# Patient Record
Sex: Female | Born: 1975 | Race: White | Hispanic: No | Marital: Single | Smoking: Current some day smoker
Health system: Southern US, Community
[De-identification: ages and names within clinical notes are randomized; demographics above are authoritative.]

## PROBLEM LIST (undated history)

## (undated) DIAGNOSIS — F1010 Alcohol abuse, uncomplicated: Secondary | ICD-10-CM

---

## 1998-10-04 ENCOUNTER — Inpatient Hospital Stay (HOSPITAL_COMMUNITY): Admission: AD | Admit: 1998-10-04 | Discharge: 1998-10-06 | Payer: Self-pay | Admitting: Obstetrics

## 1998-10-13 ENCOUNTER — Inpatient Hospital Stay (HOSPITAL_COMMUNITY): Admission: AD | Admit: 1998-10-13 | Discharge: 1998-10-15 | Payer: Self-pay | Admitting: Obstetrics

## 1998-10-14 ENCOUNTER — Encounter: Payer: Self-pay | Admitting: Obstetrics

## 1998-10-26 ENCOUNTER — Encounter: Admission: RE | Admit: 1998-10-26 | Discharge: 1998-10-26 | Payer: Self-pay | Admitting: Obstetrics & Gynecology

## 2003-11-20 ENCOUNTER — Emergency Department (HOSPITAL_COMMUNITY): Admission: AC | Admit: 2003-11-20 | Discharge: 2003-11-20 | Payer: Self-pay
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
## 2013-10-12 ENCOUNTER — Emergency Department (HOSPITAL_COMMUNITY)
Admission: EM | Admit: 2013-10-12 | Discharge: 2013-10-12 | Disposition: A | Discharge status: Home / Self Care | Payer: Self-pay | Attending: Emergency Medicine | Admitting: Emergency Medicine

## 2013-10-12 ENCOUNTER — Encounter (HOSPITAL_COMMUNITY): Payer: Self-pay | Admitting: Emergency Medicine

## 2013-10-12 DIAGNOSIS — F911 Conduct disorder, childhood-onset type: Secondary | ICD-10-CM | POA: Insufficient documentation

## 2013-10-12 DIAGNOSIS — F1010 Alcohol abuse, uncomplicated: Secondary | ICD-10-CM | POA: Insufficient documentation

## 2013-10-12 DIAGNOSIS — F17200 Nicotine dependence, unspecified, uncomplicated: Secondary | ICD-10-CM | POA: Insufficient documentation

## 2013-10-12 HISTORY — DX: Alcohol abuse, uncomplicated: F10.10

## 2013-10-12 NOTE — ED Provider Notes (Signed)
CSN: [id number]     Arrival date & time 10/12/13  1814 History   First MD Initiated Contact with Patient 10/12/13 2143     Chief Complaint  Patient presents with  . detox     alcohol     (Consider location/radiation/quality/duration/timing/severity/associated sxs/prior Treatment) HPI Comments: Patient is a 38 year old female who presents to the ED tonight for detox from alcohol. She drinks alcohol sporadically. She states "I can make dinner and have a glass of wine, or I can make dinner and not have a glass of wine". She does not drink every day. Her last drink was last night. She states she drank a few beers and a few shots. She would like detox because it was recommended by her lawyer to get inpatient treatment as she has 3 DUIs. She denies using any other drugs. No SI/HI. No other symptoms.   The history is provided by the patient. No language interpreter was used.    Past Medical History  Diagnosis Date  . Alcohol abuse    History reviewed. No pertinent past surgical history. History reviewed. No pertinent family history. History  Substance Use Topics  . Smoking status: Current Some Day Smoker    Types: Cigarettes  . Smokeless tobacco: Not on file  . Alcohol Use: Yes     Comment: patietn reports 1o drinks per weeks both beer and liquor   OB History   Grav Para Term Preterm Abortions TAB SAB Ect Mult Living                 Review of Systems  Constitutional: Negative for fever and chills.  Gastrointestinal: Negative for nausea, vomiting and abdominal pain.  Neurological: Negative for seizures.  All other systems reviewed and are negative.      Allergies  Review of patient's allergies indicates no known allergies.  Home Medications   Current Outpatient Rx  Name  Route  Sig  Dispense  Refill  . ibuprofen (ADVIL,MOTRIN) 200 MG tablet   Oral   Take 400 mg by mouth every 6 (six) hours as needed for headache or moderate pain.          BP 141/92  Pulse 69   Temp(Src) 98.7 F (37.1 C) (Oral)  Resp 20  Ht 5\' 2"  (1.575 m)  Wt 125 lb (56.7 kg)  BMI 22.86 kg/m2  SpO2 100%  LMP 09/24/2013 Physical Exam  Nursing note and vitals reviewed. Constitutional: She is oriented to person, place, and time. She appears well-developed and well-nourished. No distress.  HENT:  Head: Normocephalic and atraumatic.  Right Ear: External ear normal.  Left Ear: External ear normal.  Nose: Nose normal.  Mouth/Throat: Oropharynx is clear and moist.  Eyes: Conjunctivae and EOM are normal.  Neck: Normal range of motion. Neck supple.  Cardiovascular: Normal rate.   Pulmonary/Chest: Effort normal.  Abdominal: She exhibits no distension.  Musculoskeletal: Normal range of motion.  Neurological: She is alert and oriented to person, place, and time.  Skin: Skin is warm and dry. She is not diaphoretic.  Psychiatric: Her affect is angry.    ED Course  Procedures (including critical care time) Labs Review Labs Reviewed - No data to display Imaging Review No results found.   EKG Interpretation None      MDM   Final diagnoses:  Alcohol abuse   Patient presents to the ED for detox. She has 3 DUIs and was told by her lawyer to come here for inpatient criteria. The patient's  alcohol abuse is sporadic and she does not drink every day. Her last drink was yesterday and she does not appear to have any symptoms of alcohol withdrawal. She does not meet inpatient criteria. I explained this to her and patient appears frustrated. I wrote a note to her lawyer expresses that she was here, but she does not meet the criteria we have for inpatient detox. She was given a list of outpatient resources for which she can follow up with. Patient is very well appearing. Vital signs stable for discharge. Patient / Family / Caregiver informed of clinical course, understand medical decision-making process, and agree with plan.  Mora BellmanHannah S Teddie Curd, PA-C 10/12/13 2319

## 2013-10-12 NOTE — ED Notes (Signed)
Patient states she is here for alcohol detox. Patient reports that her alcohol intake is inconsistent.

## 2013-10-12 NOTE — Discharge Instructions (Signed)
Alcohol Problems °Most adults who drink alcohol drink in moderation (not a lot) are at low risk for developing problems related to their drinking. However, all drinkers, including low-risk drinkers, should know about the health risks connected with drinking alcohol. °RECOMMENDATIONS FOR LOW-RISK DRINKING  °Drink in moderation. Moderate drinking is defined as follows:  °· Men - no more than 2 drinks per day. °· Nonpregnant women - no more than 1 drink per day. °· Over age 65 - no more than 1 drink per day. °A standard drink is 12 grams of pure alcohol, which is equal to a 12 ounce bottle of beer or wine cooler, a 5 ounce glass of wine, or 1.5 ounces of distilled spirits (such as whiskey, brandy, vodka, or rum).  °ABSTAIN FROM (DO NOT DRINK) ALCOHOL: °· When pregnant or considering pregnancy. °· When taking a medication that interacts with alcohol. °· If you are alcohol dependent. °· A medical condition that prohibits drinking alcohol (such as ulcer, liver disease, or heart disease). °DISCUSS WITH YOUR CAREGIVER: °· If you are at risk for coronary heart disease, discuss the potential benefits and risks of alcohol use: Light to moderate drinking is associated with lower rates of coronary heart disease in certain populations (for example, men over age 45 and postmenopausal women). Infrequent or nondrinkers are advised not to begin light to moderate drinking to reduce the risk of coronary heart disease so as to avoid creating an alcohol-related problem. Similar protective effects can likely be gained through proper diet and exercise. °· Women and the elderly have smaller amounts of body water than men. As a result women and the elderly achieve a higher blood alcohol concentration after drinking the same amount of alcohol. °· Exposing a fetus to alcohol can cause a broad range of birth defects referred to as Fetal Alcohol Syndrome (FAS) or Alcohol-Related Birth Defects (ARBD). Although FAS/ARBD is connected with excessive  alcohol consumption during pregnancy, studies also have reported neurobehavioral problems in infants born to mothers reporting drinking an average of 1 drink per day during pregnancy. °· Heavier drinking (the consumption of more than 4 drinks per occasion by men and more than 3 drinks per occasion by women) impairs learning (cognitive) and psychomotor functions and increases the risk of alcohol-related problems, including accidents and injuries. °CAGE QUESTIONS:  °· Have you ever felt that you should Cut down on your drinking? °· Have people Annoyed you by criticizing your drinking? °· Have you ever felt bad or Guilty about your drinking? °· Have you ever had a drink first thing in the morning to steady your nerves or get rid of a hangover (Eye opener)? °If you answered positively to any of these questions: You may be at risk for alcohol-related problems if alcohol consumption is:  °· Men: Greater than 14 drinks per week or more than 4 drinks per occasion. °· Women: Greater than 7 drinks per week or more than 3 drinks per occasion. °Do you or your family have a medical history of alcohol-related problems, such as: °· Blackouts. °· Sexual dysfunction. °· Depression. °· Trauma. °· Liver dysfunction. °· Sleep disorders. °· Hypertension. °· Chronic abdominal pain. °· Has your drinking ever caused you problems, such as problems with your family, problems with your work (or school) performance, or accidents/injuries? °· Do you have a compulsion to drink or a preoccupation with drinking? °· Do you have poor control or are you unable to stop drinking once you have started? °· Do you have to drink to   avoid withdrawal symptoms? °· Do you have problems with withdrawal such as tremors, nausea, sweats, or mood disturbances? °· Does it take more alcohol than in the past to get you high? °· Do you feel a strong urge to drink? °· Do you change your plans so that you can have a drink? °· Do you ever drink in the morning to relieve  the shakes or a hangover? °If you have answered a number of the previous questions positively, it may be time for you to talk to your caregivers, family, and friends and see if they think you have a problem. Alcoholism is a chemical dependency that keeps getting worse and will eventually destroy your health and relationships. Many alcoholics end up dead, impoverished, or in prison. This is often the end result of all chemical dependency. °· Do not be discouraged if you are not ready to take action immediately. °· Decisions to change behavior often involve up and down desires to change and feeling like you cannot decide. °· Try to think more seriously about your drinking behavior. °· Think of the reasons to quit. °WHERE TO GO FOR ADDITIONAL INFORMATION  °· The National Institute on Alcohol Abuse and Alcoholism (NIAAA) °www.niaaa.nih.gov °· National Council on Alcoholism and Drug Dependence (NCADD) °www.ncadd.org °· American Society of Addiction Medicine (ASAM) °www.asam.org  °Document Released: 07/10/2005 Document Revised: 10/02/2011 Document Reviewed: 02/26/2008 °ExitCare® Patient Information ©2014 [employer]. ° ° °Emergency Department Resource Guide °1) Find a Doctor and Pay Out of Pocket °Although you won't have to find out who is covered by your insurance plan, it is a good idea to ask around and get recommendations. You will then need to call the office and see if the doctor you have chosen will accept you as a new patient and what types of options they offer for patients who are self-pay. Some doctors offer discounts or will set up payment plans for their patients who do not have insurance, but you will need to ask so you aren't surprised when you get to your appointment. ° °2) Contact Your Local Health Department °Not all health departments have doctors that can see patients for sick visits, but many do, so it is worth a call to see if yours does. If you don't know where your local health department is, you  can check in your phone book. The CDC also has a tool to help you locate your state's health department, and many state websites also have listings of all of their local health departments. ° °3) Find a Walk-in Clinic °If your illness is not likely to be very severe or complicated, you may want to try a walk in clinic. These are popping up all over the country in pharmacies, drugstores, and shopping centers. They're usually staffed by nurse practitioners or physician assistants that have been trained to treat common illnesses and complaints. They're usually fairly quick and inexpensive. However, if you have serious medical issues or chronic medical problems, these are probably not your best option. ° °No Primary Care Doctor: °- Call [facility] at  832-8000 - they can help you locate a primary care doctor that  accepts your insurance, provides certain services, etc. °- Physician Referral Service- 1-800-533-3463 ° °Chronic Pain Problems: °Organization         Address  Phone   Notes  °[facility]  (336) 297-2271 Patients need to be referred by their primary care doctor.  ° °Medication Assistance: °Organization           Address  Phone   Notes  °[place] [facility] [address] °[place], [place] [address] (336) 641-8030 --Must be a resident of [place] °-- Must have NO insurance coverage whatsoever (no Medicaid/ Medicare, etc.) °-- The pt. MUST have a primary care doctor that directs their care regularly and follows them in the community °  °[facility]  (866) 331-1348   °[employer]  (888) 892-1162   ° °Agencies that provide inexpensive medical care: °Organization         Address  Phone   Notes  °[facility]  (336) 832-8035   °[facility]    (336) 832-7272   °[facility] [address] °[place], [place] [address] (336) 832-4777   °[facility] [address], °[place] (336)  271-4999   °[facility]    (336) 373-0678   °[facility]    (336) 272-1050   °[facility] ° [address], [place] Phone:  (336) 832-4444, Fax:  (336) 832-4440 Hours of Operation:  9 am - 6 pm, M-F.  Also accepts Medicaid/Medicare and self-pay.  °[facility] ° [address], [place] Phone: (336) 832-3150, Fax: (336) 832-3151. Hours of Operation:  8:30 am - 5:30 pm, M-F.  Also accepts Medicaid and self-pay.  °[facility] [place] [address], [place] Phone: (336) 878-6027   °[facility] [address], [place], [place] (336)723-1848, Ext. 123 Mondays & Thursdays: 7-9 AM.  First 15 patients are seen on a first come, first serve basis. °  ° °Medicaid-accepting [place] Providers: ° °Organization         Address  Phone   Notes  °[facility] [address], [place] (336) 641-2100 Also accepts self-pay patients.  °[facility] [address], [place] ° (336) 856-9996   °[facility] [address], [place] (336) 288-8857   °[facility] [address] [place] [address], [place] (336) 299-7000   °Veita Bland [address], [place]  ° (336) 373-1557 Only accepts [place] Access Medicaid patients after they have their name applied to their card.  ° °Self-Pay (no insurance) in [place]: ° °Organization         Address  Phone   Notes  °Sickle Cell Patients, [facility] [address], [place] (336) 832-1970   °[facility] [address], [place] (336) 832-4400   °[facility] ° [address], [place] (336) 992-4800   °[facility]/Dr. Osei-Bonsu ° [address] [place] [address], [place] or [address], [place] (336) 841-8500 Phone number for both [place] and [place] locations  is the same.  °[facility] [address], [place] (336) 299-0000   °[facility] [address] [place] [address], [place] or [address] (336) 852-7530 °(336) 878-2260   °[facility] [address], [place] (336) 350-1642, phone; (336) 294-5005, fax Sees patients 1st and 3rd Saturday of every month.  Must not qualify for public or private insurance (i.e. Medicaid, Medicare, [place] Health Choice, Veterans' Benefits) • Household income should be no more than 200% of the poverty level •The clinic cannot treat you if you are pregnant or think you   are pregnant • Sexually transmitted diseases are not treated at the clinic.  ° ° °Dental Care: °Organization         Address  Phone  Notes  °[facility] [address], [place] (336) 641-6152 Accepts children up to age 21 who are enrolled in Medicaid or [place] Health Choice; pregnant women with a Medicaid card; and children who have applied for Medicaid or [place] Health Choice, but were declined, whose parents can pay a reduced fee at time of service.  °[facility] [place]  [address], [place] (336) 641-7733 Accepts children up to age 21 who are enrolled in Medicaid or [place] Health Choice; pregnant women with a Medicaid card; and children who have applied for Medicaid or [place] Health Choice, but were declined, whose parents can pay a reduced fee at time of service.  °[facility] ° [address], [place] (336) 641-4533 Patients are seen by appointment only. Walk-ins are not accepted. [facility] will see patients 18 years of age and older. °Monday - Tuesday (8am-5pm) °Most Wednesdays (8:30-5pm) °$30 per visit, cash only  °[facility] ° [address], [place] (336) 641-4533 Patients are seen by appointment only. Walk-ins are not accepted. [facility] will see  patients 18 years of age and older. °One Wednesday Evening (Monthly: Volunteer Based).  $30 per visit, cash only  °[facility]  (919) 537-3737 for adults; Children under age 4, call Graduate Pediatric Dentistry at (919) [phone]. Children aged 4-14, please call (919) [phone] to request a pediatric application. ° Dental services are provided in all areas of dental care including fillings, crowns and bridges, complete and partial dentures, implants, gum treatment, root canals, and extractions. Preventive care is also provided. Treatment is provided to both adults and children. °Patients are selected via a lottery and there is often a waiting list. °  °[facility] [address], °[place] ° (336) 763-8833 www.drcivils.com °  °[facility] [address], [place], [place] (336)723-1848, Ext. 123 Second and Fourth Thursday of each month, opens at 6:30 AM; Clinic ends at 9 AM.  Patients are seen on a first-come first-served basis, and a limited number are seen during each clinic.  ° °[facility] ° [address], [place], [place] (336) 723-7904   Eligibility Requirements °You must have lived in [place], [place], or [place] counties for at least the last three months. °  You cannot be eligible for state or federal sponsored healthcare insurance, including Veterans Administration, Medicaid, or Medicare. °  You generally cannot be eligible for healthcare insurance through your employer.  °  How to apply: °Eligibility screenings are held every Tuesday and Wednesday afternoon from 1:00 pm until 4:00 pm. You do not need an appointment for the interview!  °[facility] [address], [place], [place] 336-631-2330   °[facility]  336-342-8273   °[facility]  336-703-3100   °[facility]  336-570-6415   ° °Behavioral Health Resources in the Community: °Intensive Outpatient  Programs °Organization         Address  Phone  Notes  °[facility] [address], [place], [place] 336-878-6098   °[facility] [address], [place],  336-832-9800   °[facility] [address], [place], [place] ° 336-882-2125   °[facility] [address],  °[place], [place] 1-800-853-5163 or 336-641-4981   °Substance Abuse Resources °Organization         Address  Phone  Notes  °[facility]  336-882-2125   °[facility]  336-784-9470   °[facility]  336-285-9073   °[facility]  336-845-3988   °Residential & Outpatient Substance Abuse Program  1-800-659-3381   °Psychological Services °Organization         Address  Phone  Notes  °[facility]  336- 832-9600   °[facility]  336- 378-7881   °[facility] [address], [place] 1-800-853-5163 or 336-641-4981   ° °Mobile Crisis Teams °Organization         Address  Phone  Notes  °[facility], Mobile Crisis Care Unit  1-877-626-1772   °Assertive °[facility] ° [address] [place], [place] 336-834-9664   °Sharon DeEsch [address] °[place] 336-554-5454   ° °Self-Help/Support Groups °Organization         Address  Phone             Notes  °[facility] [place] - variety of support groups  336- 373-1402 Call for more information  °Narcotics Anonymous (NA), [facility] [address], °[place]  2 meetings at this location  ° °Residential Treatment Programs °Organization         Address  Phone  Notes  °[facility] [address],    °[place]  1-866-801-8205   °[facility] ° [address], [place], [place] 704-293-8524   °[facility] [address], [place] 336-845-3988 Admissions: 8am-3pm M-F  °[facility] [address]    °[place], [place]  336-841-1104   °[facility] [address], [place], [place] 336-379-7146   °[facility] [address]  °[place], [place] 336-285-9073   °[facility] - Intensive Outpatient [address], [place], [place] 336-852-3033   °[facility] [address]  °[place], [place] 1-877-615-2722 or 336-784-9470   °[facility] [address] [place], [place] 336-227-7417 Accepts Medicaid  °[facility] [address]  °[place] 1-800-659-3381 Substance Abuse/Addiction Treatment  ° °[place] Behavioral Health Resources °Organization         Address  Phone  Notes  °[facility]  (888) 581-9988   °Julie Brannon, PhD [address] [place], [place]   (336) 349-5553 or (336) 951-0000   °[facility]   [address] °[place], [place] (336) 349-4454   °[facility] [address], [place], [place] (336) 342-8316 Insurance/Medicaid/sponsorship through [facility]  °[facility] [address]                                    [place], [place] (336) 342-8316 Therapy/tele-psych/case  °[facility] [address]  ° [place], [place] (336) 349-2233    °Dr. Arfeen  (336) 349-4544   °[facility] [place]  [employer] [place] [facility] 1) [address], [place] °2) [address], [place] °3)  [address], [place] (336) 349-3220 °(336) 342-7768 ° °(336) 342-8140   °[place] [facility] (336)   342-1394 or (336) 342-3537 (After Hours)    ° ° ° °

## 2013-10-14 NOTE — ED Provider Notes (Signed)
Medical screening examination/treatment/procedure(s) were performed by non-physician practitioner and as supervising physician I was immediately available for consultation/collaboration.   EKG Interpretation None        Anglea Gordner E Shanae Luo, MD 10/14/13 1113
